# Patient Record
Sex: Female | Born: 1950 | Race: White | Hispanic: No | Smoking: Former smoker
Health system: Southern US, Community
[De-identification: ages and names within clinical notes are randomized; demographics above are authoritative.]

## PROBLEM LIST (undated history)

## (undated) DIAGNOSIS — K219 Gastro-esophageal reflux disease without esophagitis: Secondary | ICD-10-CM

## (undated) HISTORY — PX: BREAST BIOPSY: SHX20

---

## 2011-02-10 ENCOUNTER — Emergency Department: Payer: Self-pay | Admitting: Emergency Medicine

## 2012-09-15 ENCOUNTER — Ambulatory Visit: Payer: Self-pay | Admitting: Gastroenterology

## 2012-10-24 ENCOUNTER — Ambulatory Visit: Payer: Self-pay | Admitting: Gastroenterology

## 2012-10-25 LAB — PATHOLOGY REPORT

## 2014-08-07 ENCOUNTER — Other Ambulatory Visit: Payer: Self-pay | Admitting: Internal Medicine

## 2014-08-07 DIAGNOSIS — N63 Unspecified lump in unspecified breast: Secondary | ICD-10-CM

## 2014-08-07 DIAGNOSIS — R921 Mammographic calcification found on diagnostic imaging of breast: Secondary | ICD-10-CM

## 2014-08-15 ENCOUNTER — Ambulatory Visit
Admission: RE | Admit: 2014-08-15 | Discharge: 2014-08-15 | Disposition: A | Discharge status: Home / Self Care | Payer: BLUE CROSS/BLUE SHIELD | Source: Ambulatory Visit | Attending: Internal Medicine | Admitting: Internal Medicine

## 2014-08-15 ENCOUNTER — Ambulatory Visit: Payer: BLUE CROSS/BLUE SHIELD

## 2014-08-15 ENCOUNTER — Other Ambulatory Visit: Payer: Self-pay | Admitting: Internal Medicine

## 2014-08-15 ENCOUNTER — Ambulatory Visit: Payer: Self-pay

## 2014-08-15 DIAGNOSIS — N63 Unspecified lump in unspecified breast: Secondary | ICD-10-CM

## 2014-08-15 DIAGNOSIS — R921 Mammographic calcification found on diagnostic imaging of breast: Secondary | ICD-10-CM | POA: Insufficient documentation

## 2015-02-23 IMAGING — RF DG BARIUM SWALLOW
10 of 14 series · 15 of 23 positions shown · non-contrast
Comparison: none

REASON FOR EXAM: dysphagia
COMMENTS:

PROCEDURE:     FL  - FL BARIUM SWALLOW  - September 15, 2012  [DATE]
RESULT:     Standard barium swallow was obtained. Small sliding hiatal
hernia is noted with slightly prominent B ring. Barium tablet passed through
the ring with slight delay. No reflux.

[Series 1: fluoro_barium 2fps_bw · 0.18mm/px · 2 of 9 frames shown (1 of 10)]
[frame 2/9]
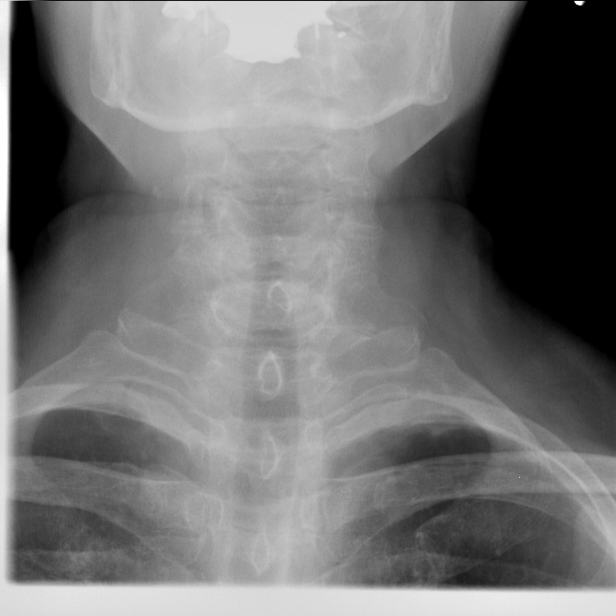
[frame 8/9]
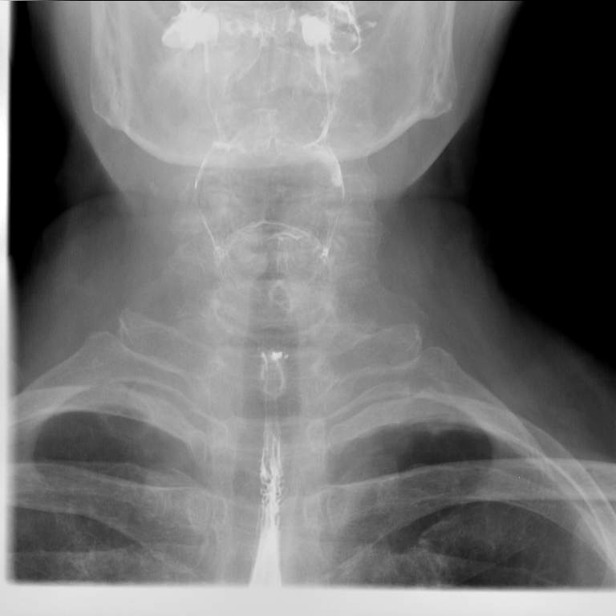

[Series 2: fluoro_barium 2fps_bw · 0.18mm/px · 3 of 6 frames shown (2 of 10)]
[frame 1/6]
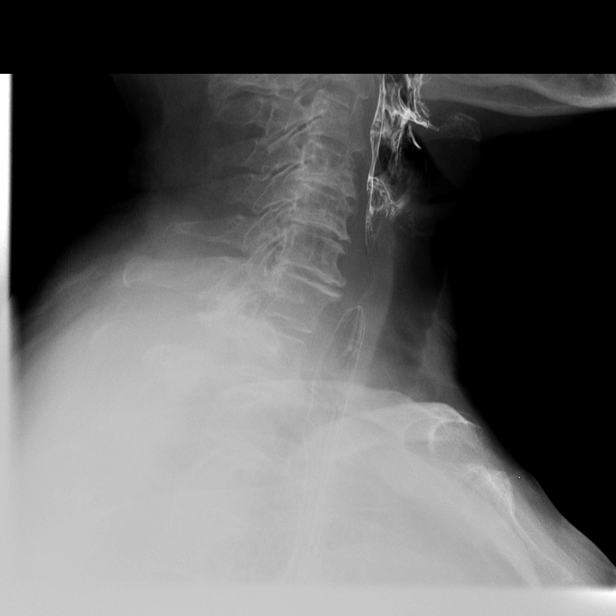
[frame 4/6]
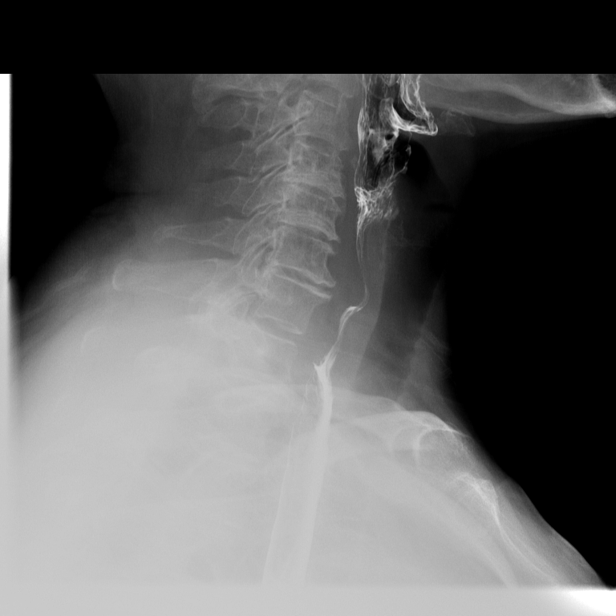
[frame 6/6]
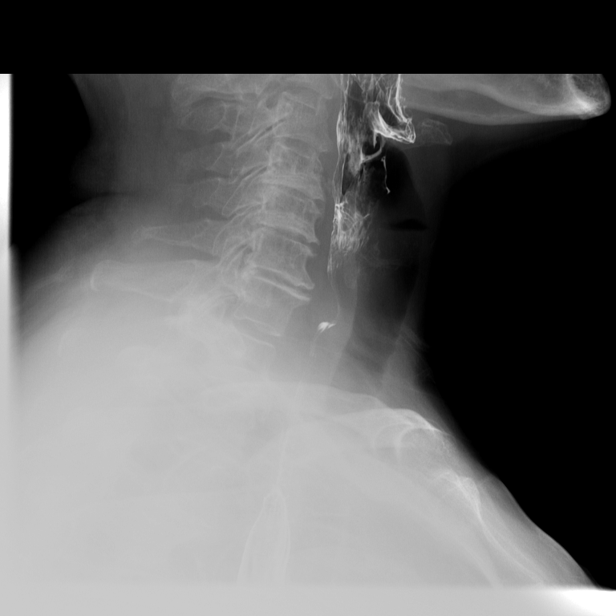

[Series 3: fluoro_barium 2fps_bw · 0.18mm/px · 2 of 4 frames shown (3 of 10)]
[frame 3/4]
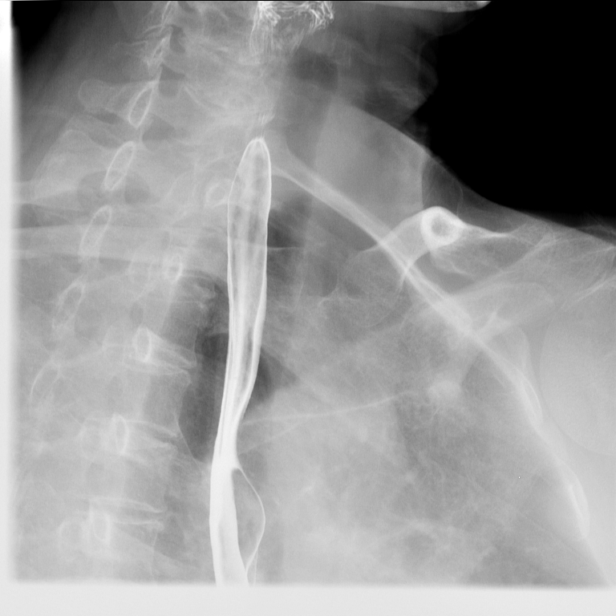
[frame 4/4]
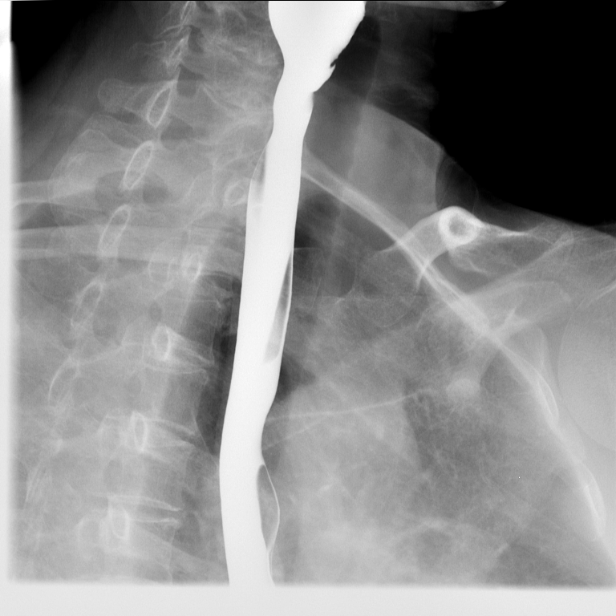

[Series 5: fluoro_barium 2fps_bw · 0.18mm/px · 1 of 1 slices shown (4 of 10)]
[im 1/1]
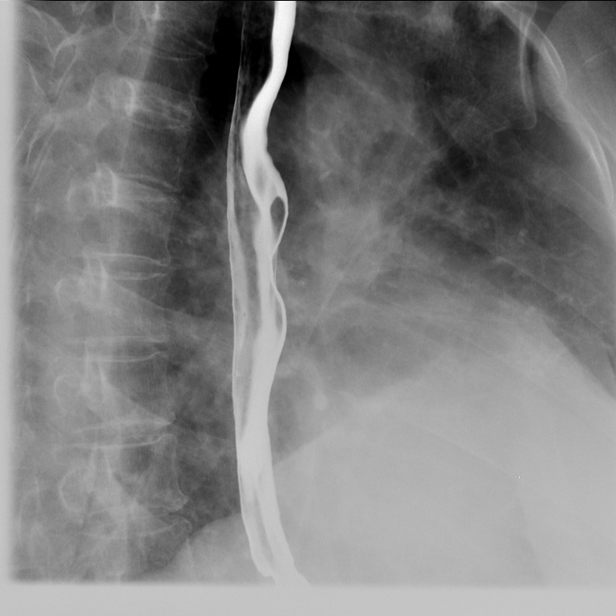

[Series 7: fluoro_barium 2fps_bw · 0.18mm/px · 1 of 1 slices shown (5 of 10)]
[im 1/1]
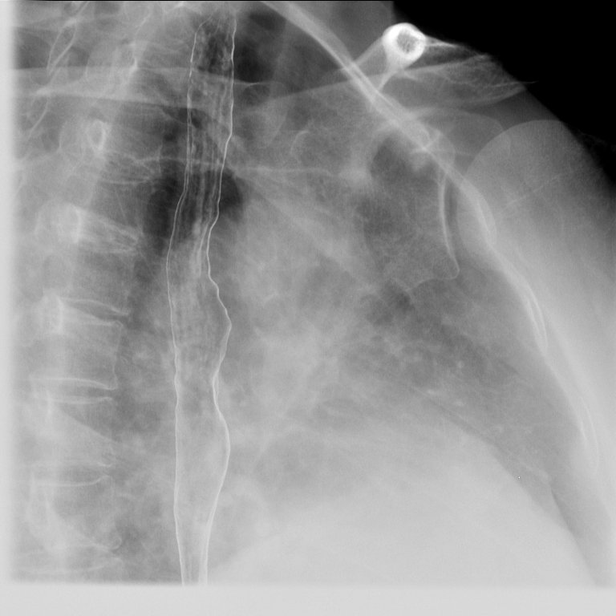

[Series 8: fluoro_barium 2fps_bw · 0.19mm/px · 1 of 1 slices shown (6 of 10)]
[im 1/1]
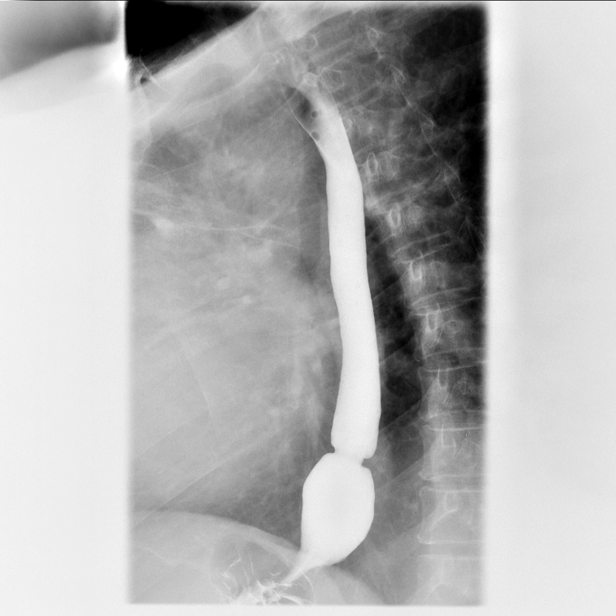

[Series 10: fluoro_barium 2fps_bw · 0.20mm/px · 2 of 3 frames shown (7 of 10)]
[frame 1/3]
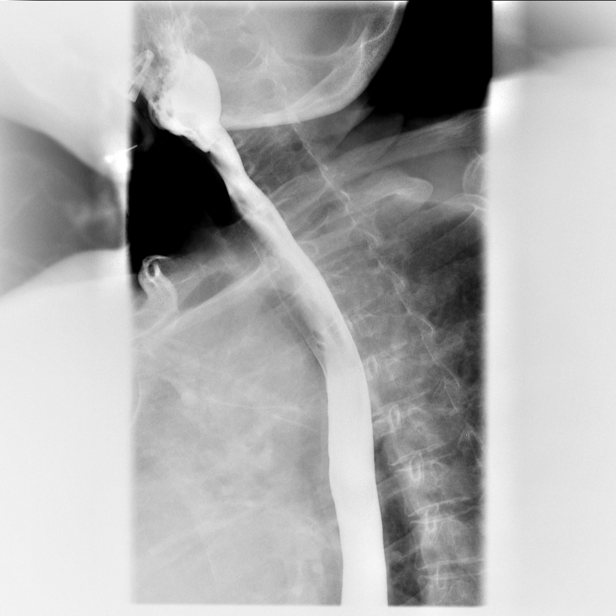
[frame 2/3]
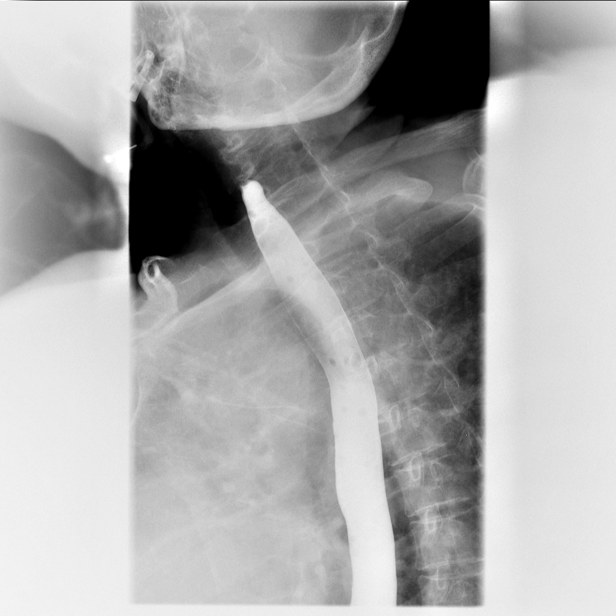

[Series 11: fluoro_barium 2fps_bw · 0.20mm/px · 1 of 1 slices shown (8 of 10)]
[im 1/1]
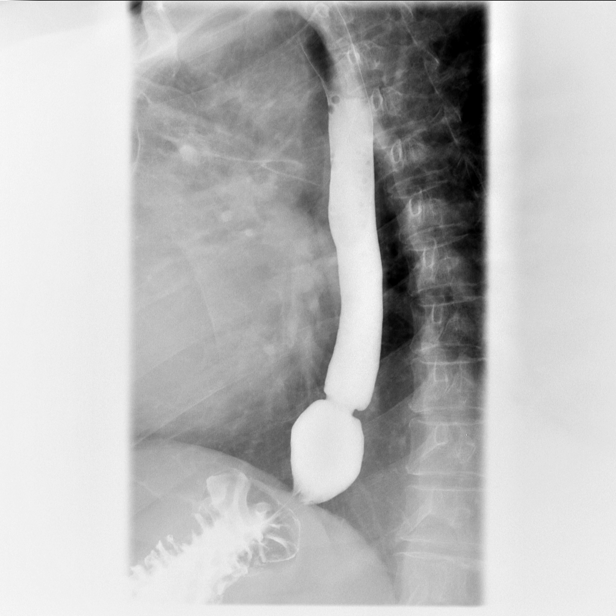

[Series 12: fluoro_barium 2fps_bw · 0.18mm/px · 1 of 1 slices shown (9 of 10)]
[im 1/1]
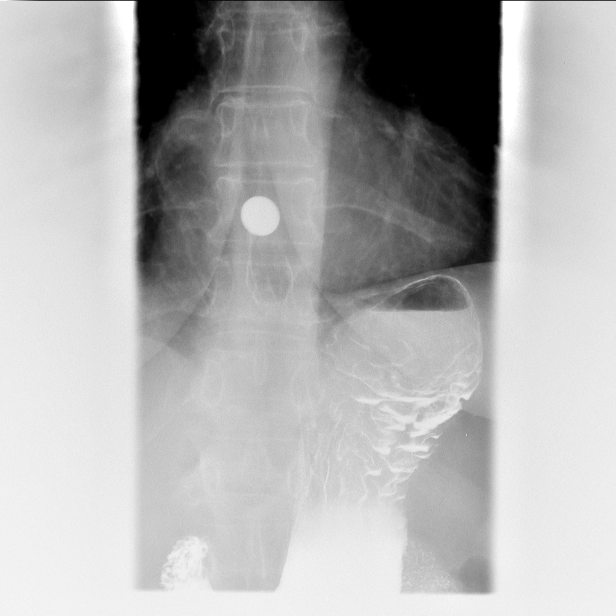

[Series 14: fluoro_barium 2fps_bw · 0.18mm/px · 1 of 1 slices shown (10 of 10)]
[im 1/1]
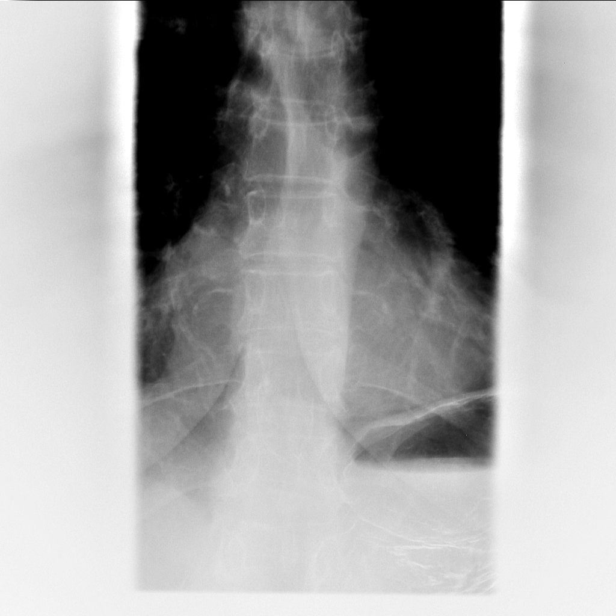

[15 of 23 positions shown; findings below may reference images not displayed]

IMPRESSION: Small hiatal hernia with B ring. There was slight delay in
passage of a standardized barium tablet.

## 2018-01-25 DIAGNOSIS — L237 Allergic contact dermatitis due to plants, except food: Secondary | ICD-10-CM | POA: Diagnosis not present

## 2018-01-25 DIAGNOSIS — Z23 Encounter for immunization: Secondary | ICD-10-CM | POA: Diagnosis not present

## 2022-02-02 DIAGNOSIS — D225 Melanocytic nevi of trunk: Secondary | ICD-10-CM | POA: Diagnosis not present

## 2022-02-02 DIAGNOSIS — L821 Other seborrheic keratosis: Secondary | ICD-10-CM | POA: Diagnosis not present

## 2022-02-02 DIAGNOSIS — L578 Other skin changes due to chronic exposure to nonionizing radiation: Secondary | ICD-10-CM | POA: Diagnosis not present

## 2022-02-02 DIAGNOSIS — C4441 Basal cell carcinoma of skin of scalp and neck: Secondary | ICD-10-CM | POA: Diagnosis not present

## 2022-02-02 DIAGNOSIS — D485 Neoplasm of uncertain behavior of skin: Secondary | ICD-10-CM | POA: Diagnosis not present

## 2022-02-02 DIAGNOSIS — D2262 Melanocytic nevi of left upper limb, including shoulder: Secondary | ICD-10-CM | POA: Diagnosis not present

## 2022-02-02 DIAGNOSIS — C44319 Basal cell carcinoma of skin of other parts of face: Secondary | ICD-10-CM | POA: Diagnosis not present

## 2022-02-02 DIAGNOSIS — D2261 Melanocytic nevi of right upper limb, including shoulder: Secondary | ICD-10-CM | POA: Diagnosis not present

## 2022-03-17 DIAGNOSIS — C44319 Basal cell carcinoma of skin of other parts of face: Secondary | ICD-10-CM | POA: Diagnosis not present

## 2022-04-07 DIAGNOSIS — Z48817 Encounter for surgical aftercare following surgery on the skin and subcutaneous tissue: Secondary | ICD-10-CM | POA: Diagnosis not present

## 2022-04-07 DIAGNOSIS — C44319 Basal cell carcinoma of skin of other parts of face: Secondary | ICD-10-CM | POA: Diagnosis not present

## 2022-04-14 DIAGNOSIS — Z48817 Encounter for surgical aftercare following surgery on the skin and subcutaneous tissue: Secondary | ICD-10-CM | POA: Diagnosis not present

## 2022-05-03 ENCOUNTER — Telehealth: Payer: Self-pay

## 2022-05-10 DIAGNOSIS — Z48817 Encounter for surgical aftercare following surgery on the skin and subcutaneous tissue: Secondary | ICD-10-CM | POA: Diagnosis not present

## 2022-05-27 ENCOUNTER — Encounter: Payer: Self-pay | Admitting: Plastic Surgery

## 2022-05-27 ENCOUNTER — Ambulatory Visit: Payer: PPO | Admitting: Plastic Surgery

## 2022-05-27 VITALS — BP 149/82 | HR 71 | Ht 59.0 in | Wt 142.8 lb

## 2022-05-27 DIAGNOSIS — C4441 Basal cell carcinoma of skin of scalp and neck: Secondary | ICD-10-CM

## 2022-05-27 DIAGNOSIS — M952 Other acquired deformity of head: Secondary | ICD-10-CM | POA: Diagnosis not present

## 2022-05-27 DIAGNOSIS — C4491 Basal cell carcinoma of skin, unspecified: Secondary | ICD-10-CM

## 2022-05-27 NOTE — Progress Notes (Signed)
   Referring Provider Modena Jansky, MD [address] [place],  [place] [address]   CC:  Chief Complaint  Patient presents with   Advice Only      Wendy Wilson is an 72 y.o. female.  HPI: Wendy Wilson was referred by Dr. 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  The initial Mohs reconstruction was in late January or early February per her husband and the wound has been slowly granulating since that time.  They are treating it with local wound care including using Vaseline to maintain moisture in the wound and over the exposed cranium.  No Known Allergies  Outpatient Encounter Medications as of 05/27/2022  Medication Sig   calcium carbonate (TUMS - DOSED IN MG ELEMENTAL CALCIUM) 500 MG chewable tablet Chew 1 tablet by mouth daily.   ibuprofen (ADVIL) 200 MG tablet Take by mouth.   No facility-administered encounter medications on file as of 05/27/2022.     No past medical history on file.  Past Surgical History:  Procedure Laterality Date   BREAST BIOPSY Right 20+ YRS AGO   EXCISIONAL - BENIGN    No family history on file.  Social History   Social History Narrative   Not on file     Review of Systems General: Denies fevers, chills, weight loss CV: Denies chest pain, shortness of breath, palpitations Scalp defects: 2 scalp defects 1 in the frontal region and 1 in the right parietal region  Physical Exam    05/27/2022   11:14 AM  Vitals with BMI  Height 4\' 11"   Weight 142 lbs 13 oz  BMI 28.83  Systolic 149  Diastolic 82  Pulse 71    General:  No acute distress,  Alert and oriented, Non-Toxic, Normal speech and affect Scalp defects as noted above. The frontal defect is essentially healed.  The parietal defect has exposed scalp of approximately 2 mm x 2 mm with granulation tissue around the rest of the wound. Mammogram: Not applicable Assessment/Plan Scalp defects: At this time I would be hesitant to take the patient for bone  burring while she is still healing.  I believe very close observation and maintaining a moist environment for healing are essential if we are going to continue conservative therapy.  I discussed the importance of the dressing changes with Wendy Wilson husband.  They are agreeable to the solution and will return to see me every 10 to 14 days while the wound is healing.  If the wound does not completely heal after the next 2 visits we will reconsider this and potentially take the patient to the operating room.  Santiago Glad 05/27/2022, 4:52 PM

## 2022-06-03 ENCOUNTER — Institutional Professional Consult (permissible substitution): Payer: PPO | Admitting: Plastic Surgery

## 2022-06-09 ENCOUNTER — Ambulatory Visit: Payer: PPO | Admitting: Plastic Surgery

## 2022-06-21 ENCOUNTER — Institutional Professional Consult (permissible substitution): Payer: BLUE CROSS/BLUE SHIELD | Admitting: Plastic Surgery

## 2022-08-16 ENCOUNTER — Institutional Professional Consult (permissible substitution): Payer: PPO | Admitting: Plastic Surgery

## 2023-01-27 DIAGNOSIS — H2513 Age-related nuclear cataract, bilateral: Secondary | ICD-10-CM | POA: Diagnosis not present

## 2023-01-27 DIAGNOSIS — H31011 Macula scars of posterior pole (postinflammatory) (post-traumatic), right eye: Secondary | ICD-10-CM | POA: Diagnosis not present

## 2023-01-27 DIAGNOSIS — H25013 Cortical age-related cataract, bilateral: Secondary | ICD-10-CM | POA: Diagnosis not present

## 2023-01-27 DIAGNOSIS — H524 Presbyopia: Secondary | ICD-10-CM | POA: Diagnosis not present

## 2023-02-17 DIAGNOSIS — H353114 Nonexudative age-related macular degeneration, right eye, advanced atrophic with subfoveal involvement: Secondary | ICD-10-CM | POA: Diagnosis not present

## 2023-02-17 DIAGNOSIS — H353131 Nonexudative age-related macular degeneration, bilateral, early dry stage: Secondary | ICD-10-CM | POA: Diagnosis not present

## 2023-02-17 DIAGNOSIS — H2513 Age-related nuclear cataract, bilateral: Secondary | ICD-10-CM | POA: Diagnosis not present

## 2023-02-17 DIAGNOSIS — H353121 Nonexudative age-related macular degeneration, left eye, early dry stage: Secondary | ICD-10-CM | POA: Diagnosis not present

## 2023-07-14 DIAGNOSIS — H43813 Vitreous degeneration, bilateral: Secondary | ICD-10-CM | POA: Diagnosis not present

## 2023-07-14 DIAGNOSIS — H31011 Macula scars of posterior pole (postinflammatory) (post-traumatic), right eye: Secondary | ICD-10-CM | POA: Diagnosis not present

## 2023-07-14 DIAGNOSIS — H2513 Age-related nuclear cataract, bilateral: Secondary | ICD-10-CM | POA: Diagnosis not present

## 2023-08-02 DIAGNOSIS — H2511 Age-related nuclear cataract, right eye: Secondary | ICD-10-CM | POA: Diagnosis not present

## 2023-08-02 DIAGNOSIS — H2513 Age-related nuclear cataract, bilateral: Secondary | ICD-10-CM | POA: Diagnosis not present

## 2023-08-08 ENCOUNTER — Encounter: Payer: Self-pay | Admitting: Ophthalmology

## 2023-08-09 NOTE — Discharge Instructions (Signed)

## 2023-08-12 ENCOUNTER — Ambulatory Visit
Admission: RE | Admit: 2023-08-12 | Discharge: 2023-08-12 | Disposition: A | Discharge status: Home / Self Care | Attending: Ophthalmology | Admitting: Ophthalmology

## 2023-08-12 ENCOUNTER — Encounter: Payer: Self-pay | Admitting: Ophthalmology

## 2023-08-12 ENCOUNTER — Other Ambulatory Visit: Payer: Self-pay

## 2023-08-12 ENCOUNTER — Ambulatory Visit: Payer: Self-pay | Admitting: Anesthesiology

## 2023-08-12 ENCOUNTER — Encounter
Admission: RE | Disposition: A | Discharge status: Home / Self Care | Payer: Self-pay | Source: Home / Self Care | Attending: Ophthalmology

## 2023-08-12 DIAGNOSIS — K219 Gastro-esophageal reflux disease without esophagitis: Secondary | ICD-10-CM | POA: Diagnosis not present

## 2023-08-12 DIAGNOSIS — Z87891 Personal history of nicotine dependence: Secondary | ICD-10-CM | POA: Diagnosis not present

## 2023-08-12 DIAGNOSIS — H2511 Age-related nuclear cataract, right eye: Secondary | ICD-10-CM | POA: Insufficient documentation

## 2023-08-12 HISTORY — DX: Gastro-esophageal reflux disease without esophagitis: K21.9

## 2023-08-12 HISTORY — PX: CATARACT EXTRACTION W/PHACO: SHX586

## 2023-08-12 SURGERY — PHACOEMULSIFICATION, CATARACT, WITH IOL INSERTION
Anesthesia: Monitor Anesthesia Care | Laterality: Right

## 2023-08-12 MED ORDER — MIDAZOLAM HCL 2 MG/2ML IJ SOLN
INTRAMUSCULAR | Status: AC
Start: 2023-08-12 — End: 2023-08-12
  Filled 2023-08-12: qty 2

## 2023-08-12 MED ORDER — MIDAZOLAM HCL 2 MG/2ML IJ SOLN
INTRAMUSCULAR | Status: DC | PRN
  Administered 2023-08-12 (×2): 1 mg via INTRAVENOUS

## 2023-08-12 MED ORDER — MOXIFLOXACIN HCL 0.5 % OP SOLN
OPHTHALMIC | Status: DC | PRN
  Administered 2023-08-12: .2 mL via OPHTHALMIC

## 2023-08-12 MED ORDER — FENTANYL CITRATE (PF) 100 MCG/2ML IJ SOLN
INTRAMUSCULAR | Status: DC | PRN
  Administered 2023-08-12: 50 ug via INTRAVENOUS

## 2023-08-12 MED ORDER — SIGHTPATH DOSE#1 NA HYALUR & NA CHOND-NA HYALUR IO KIT
PACK | INTRAOCULAR | Status: DC | PRN
  Administered 2023-08-12: 1 via OPHTHALMIC

## 2023-08-12 MED ORDER — LIDOCAINE HCL (PF) 2 % IJ SOLN
INTRAOCULAR | Status: DC | PRN
  Administered 2023-08-12: 1 mL via INTRAOCULAR

## 2023-08-12 MED ORDER — TETRACAINE HCL 0.5 % OP SOLN
OPHTHALMIC | Status: AC
  Filled 2023-08-12: qty 4

## 2023-08-12 MED ORDER — ARMC OPHTHALMIC DILATING DROPS
OPHTHALMIC | Status: AC
  Filled 2023-08-12: qty 0.5

## 2023-08-12 MED ORDER — EPINEPHRINE PF 1 MG/ML IJ SOLN
INTRAMUSCULAR | Status: DC | PRN
  Administered 2023-08-12: 112 mL via OPHTHALMIC

## 2023-08-12 MED ORDER — TETRACAINE HCL 0.5 % OP SOLN
1.0000 [drp] | OPHTHALMIC | Status: DC | PRN
  Administered 2023-08-12 (×3): 1 [drp] via OPHTHALMIC

## 2023-08-12 MED ORDER — FENTANYL CITRATE (PF) 100 MCG/2ML IJ SOLN
INTRAMUSCULAR | Status: AC
  Filled 2023-08-12: qty 2

## 2023-08-12 MED ORDER — LACTATED RINGERS IV SOLN
INTRAVENOUS | Status: DC
Start: 2023-08-12 — End: 2023-08-12

## 2023-08-12 MED ORDER — SIGHTPATH DOSE#1 BSS IO SOLN
INTRAOCULAR | Status: DC | PRN
  Administered 2023-08-12: 15 mL via INTRAOCULAR

## 2023-08-12 MED ORDER — ARMC OPHTHALMIC DILATING DROPS
1.0000 | OPHTHALMIC | Status: DC | PRN
  Administered 2023-08-12 (×3): 1 via OPHTHALMIC

## 2023-08-12 SURGICAL SUPPLY — 12 items
CATARACT SUITE SIGHTPATH (MISCELLANEOUS) ×1 IMPLANT
DISSECTOR HYDRO NUCLEUS 50X22 (MISCELLANEOUS) ×1 IMPLANT
FEE CATARACT SUITE SIGHTPATH (MISCELLANEOUS) ×1 IMPLANT
GLOVE PI ULTRA LF STRL 7.5 (GLOVE) ×1 IMPLANT
GLOVE SURG POLYISOPRENE 8.5 (GLOVE) ×1 IMPLANT
GLOVE SURG PROTEXIS BL SZ6.5 (GLOVE) ×1 IMPLANT
GLOVE SURG SYN 6.5 PF PI BL (GLOVE) ×1 IMPLANT
GLOVE SURG SYN 8.5 PF PI BL (GLOVE) ×1 IMPLANT
LENS IOL TECNIS EYHANCE 23.0 (Intraocular Lens) IMPLANT
NDL FILTER BLUNT 18X1 1/2 (NEEDLE) ×1 IMPLANT
NEEDLE FILTER BLUNT 18X1 1/2 (NEEDLE) ×1 IMPLANT
SYR 3ML LL SCALE MARK (SYRINGE) ×1 IMPLANT

## 2023-08-12 NOTE — Anesthesia Postprocedure Evaluation (Signed)
 Anesthesia Post Note  Patient: Wendy Wilson  Procedure(s) Performed: PHACOEMULSIFICATION, CATARACT, WITH IOL INSERTION 5.22, 00:40.7 (Right)  Patient location during evaluation: PACU Anesthesia Type: MAC Level of consciousness: awake and alert Pain management: pain level controlled Vital Signs Assessment: post-procedure vital signs reviewed and stable Respiratory status: spontaneous breathing, nonlabored ventilation and respiratory function stable Cardiovascular status: blood pressure returned to baseline and stable Postop Assessment: no apparent nausea or vomiting Anesthetic complications: no   No notable events documented.   Last Vitals:  Vitals:   08/12/23 1041 08/12/23 1047  BP: (!) 144/69 (!) 141/58  Pulse: 81 78  Resp: 17 (!) 21  Temp: (!) 36.4 C (!) 36.4 C  SpO2: 95% 95%    Last Pain:  Vitals:   08/12/23 1047  TempSrc:   PainSc: 0-No pain                 Fairy POUR Melissa Tomaselli

## 2023-08-12 NOTE — Transfer of Care (Signed)
 Immediate Anesthesia Transfer of Care Note  Patient: Wendy Wilson  Procedure(s) Performed: PHACOEMULSIFICATION, CATARACT, WITH IOL INSERTION 5.22, 00:40.7 (Right)  Patient Location: PACU  Anesthesia Type: MAC  Level of Consciousness: awake, alert  and patient cooperative  Airway and Oxygen Therapy: Patient Spontanous Breathing and Patient connected to supplemental oxygen  Post-op Assessment: Post-op Vital signs reviewed, Patient's Cardiovascular Status Stable, Respiratory Function Stable, Patent Airway and No signs of Nausea or vomiting  Post-op Vital Signs: Reviewed and stable  Complications: No notable events documented.

## 2023-08-12 NOTE — Op Note (Signed)
 OPERATIVE NOTE  Sherrine Salberg [id number] 08/12/2023   PREOPERATIVE DIAGNOSIS:  Nuclear sclerotic cataract right eye.  H25.11   POSTOPERATIVE DIAGNOSIS:    Nuclear sclerotic cataract right eye.     PROCEDURE:  Phacoemusification with posterior chamber intraocular lens placement of the right eye   LENS:   Implant Name Type Inv. Item Serial No. Manufacturer Lot No. LRB No. Used Action  LENS IOL TECNIS EYHANCE 23.0 - [id number] Intraocular Lens LENS IOL TECNIS EYHANCE 23.0 [id number] [employer]  Right 1 Implanted       Procedure(s): PHACOEMULSIFICATION, CATARACT, WITH IOL INSERTION 5.22, 00:40.7 (Right)  SURGEON:  Adine Novak, MD, MPH  ANESTHESIOLOGIST: Anesthesiologist: Piscitello, Fairy POUR, MD CRNA: Levy Harvey, CRNA; Jahoo, Sonia, CRNA   ANESTHESIA:  Topical with tetracaine drops augmented with 1% preservative-free intracameral lidocaine.  ESTIMATED BLOOD LOSS: less than 1 mL.   COMPLICATIONS:  None.   DESCRIPTION OF PROCEDURE:  The patient was identified in the holding room and transported to the operating room and placed in the supine position under the operating microscope.  The right eye was identified as the operative eye and it was prepped and draped in the usual sterile ophthalmic fashion.   A 1.0 millimeter clear-corneal paracentesis was made at the 10:30 position. 0.5 ml of preservative-free 1% lidocaine with epinephrine was injected into the anterior chamber.  The anterior chamber was filled with viscoelastic.  A 2.4 millimeter keratome was used to make a near-clear corneal incision at the 8:00 position.  A curvilinear capsulorrhexis was made with a cystotome and capsulorrhexis forceps.  Balanced salt solution was used to hydrodissect and hydrodelineate the nucleus.   Phacoemulsification was then used in stop and chop fashion to remove the lens nucleus and epinucleus.  The remaining cortex was then removed using the irrigation and aspiration handpiece.  Viscoelastic was then placed into the capsular bag to distend it for lens placement.  A lens was then injected into the capsular bag.  The remaining viscoelastic was aspirated.   Wounds were hydrated with balanced salt solution.  The anterior chamber was inflated to a physiologic pressure with balanced salt solution.   Intracameral vigamox 0.1 mL undiluted was injected into the eye and a drop placed onto the ocular surface.  No wound leaks were noted.  The patient was taken to the recovery room in stable condition without complications of anesthesia or surgery  Adine Novak 08/12/2023, 10:39 AM

## 2023-08-12 NOTE — Anesthesia Preprocedure Evaluation (Signed)
 Anesthesia Evaluation  Patient identified by MRN, date of birth, ID band Patient awake    Reviewed: Allergy & Precautions, NPO status , Patient's Chart, lab work & pertinent test results  History of Anesthesia Complications Negative for: history of anesthetic complications  Airway Mallampati: III  TM Distance: <3 FB Neck ROM: full    Dental  (+) Chipped, Upper Dentures   Pulmonary neg shortness of breath, former smoker   Pulmonary exam normal        Cardiovascular Exercise Tolerance: Good (-) angina negative cardio ROS Normal cardiovascular exam     Neuro/Psych negative neurological ROS  negative psych ROS   GI/Hepatic Neg liver ROS,GERD  Controlled,,  Endo/Other  negative endocrine ROS    Renal/GU      Musculoskeletal   Abdominal   Peds  Hematology negative hematology ROS (+)   Anesthesia Other Findings Past Medical History: No date: GERD (gastroesophageal reflux disease)     Comment:  HAS OCCASIONALLY  Past Surgical History: 20+ YRS AGO: BREAST BIOPSY; Right     Comment:  EXCISIONAL - BENIGN  BMI    Body Mass Index: 29.82 kg/m      Reproductive/Obstetrics negative OB ROS                             Anesthesia Physical Anesthesia Plan  ASA: 3  Anesthesia Plan: MAC   Post-op Pain Management:    Induction: Intravenous  PONV Risk Score and Plan:   Airway Management Planned: Natural Airway and Nasal Cannula  Additional Equipment:   Intra-op Plan:   Post-operative Plan:   Informed Consent: I have reviewed the patients History and Physical, chart, labs and discussed the procedure including the risks, benefits and alternatives for the proposed anesthesia with the patient or authorized representative who has indicated his/her understanding and acceptance.     Dental Advisory Given  Plan Discussed with: Anesthesiologist, CRNA and Surgeon  Anesthesia Plan Comments:  (Patient consented for risks of anesthesia including but not limited to:  - adverse reactions to medications - damage to eyes, teeth, lips or other oral mucosa - nerve damage due to positioning  - sore throat or hoarseness - Damage to heart, brain, nerves, lungs, other parts of body or loss of life  Patient voiced understanding and assent.)       Anesthesia Quick Evaluation

## 2023-08-12 NOTE — H&P (Signed)
 [facility]   Primary Care Physician:  [facility] Ophthalmologist: Dr. Adine Novak  Pre-Procedure History & Physical: HPI:  Wendy Wilson is a 73 y.o. female here for cataract surgery.   Past Medical History:  Diagnosis Date   GERD (gastroesophageal reflux disease)    HAS OCCASIONALLY    Past Surgical History:  Procedure Laterality Date   BREAST BIOPSY Right 20+ YRS AGO   EXCISIONAL - BENIGN    Prior to Admission medications   Medication Sig Start Date End Date Taking? Authorizing Provider  calcium carbonate (TUMS - DOSED IN MG ELEMENTAL CALCIUM) 500 MG chewable tablet Chew 1 tablet by mouth as needed for heartburn or indigestion.   Yes [provider]  ibuprofen (ADVIL) 200 MG tablet Take 600 mg by mouth 2 (two) times daily.   Yes [provider]  Misc Natural Products (NEURIVA PO) Take 1 tablet by mouth 1 day or 1 dose.   Yes [provider]  Multiple Vitamins-Minerals (PRESERVISION AREDS) TABS Take 1 tablet by mouth 1 day or 1 dose.   Yes [provider]  omeprazole (PRILOSEC) 20 MG capsule Take 20 mg by mouth as needed.   Yes [provider]  oxymetazoline (AFRIN) 0.05 % nasal spray Place 1 spray into both nostrils 2 (two) times daily as needed for congestion.   Yes [provider]  Pseudoeph-Doxylamine-DM-APAP (NYQUIL PO) Take 5 mL/day by mouth at bedtime.   Yes [provider]  trolamine salicylate (ASPERCREME) 10 % cream Apply 1 Application topically as needed for muscle pain.   Yes [provider]    Allergies as of 07/18/2023   (No Known Allergies)    History reviewed. No pertinent family history.  Social History   Socioeconomic History   Marital status: Widowed    Spouse name: Not on file   Number of children: Not on file   Years of education: Not on file   Highest education level: Not on file  Occupational History   Not on file  Tobacco Use   Smoking status:  Former    Types: Cigarettes   Smokeless tobacco: Never  Substance and Sexual Activity   Alcohol use: Yes    Alcohol/week: 1.0 standard drink of alcohol    Types: 1 Glasses of wine per week    Comment: 1 GLASS NIGHTLY   Drug use: Never   Sexual activity: Not on file  Other Topics Concern   Not on file  Social History Narrative   Not on file   Social Drivers of Health   Financial Resource Strain: Not on file  Food Insecurity: Not on file  Transportation Needs: Not on file  Physical Activity: Not on file  Stress: Not on file  Social Connections: Not on file  Intimate Partner Violence: Not on file    Review of Systems: See HPI, otherwise negative ROS  Physical Exam: BP (!) 158/74   Temp 98 F (36.7 C) (Temporal)   Ht 4' 11.02 (1.499 m)   Wt 67 kg   SpO2 96%   BMI 29.82 kg/m  General:   Alert, cooperative. Head:  Normocephalic and atraumatic. Respiratory:  Normal work of breathing. Cardiovascular:  NAD  Impression/Plan: Wendy Wilson is here for cataract surgery.  Risks, benefits, limitations, and alternatives regarding cataract surgery have been reviewed with the patient.  Questions have been answered.  All parties agreeable.   Adine Novak, MD  08/12/2023, 10:12 AM

## 2023-08-16 ENCOUNTER — Encounter: Payer: Self-pay | Admitting: Ophthalmology

## 2023-08-18 DIAGNOSIS — H2512 Age-related nuclear cataract, left eye: Secondary | ICD-10-CM | POA: Diagnosis not present

## 2023-08-18 NOTE — Anesthesia Preprocedure Evaluation (Addendum)
 Anesthesia Evaluation  Patient identified by MRN, date of birth, ID band Patient awake    Reviewed: Allergy & Precautions, H&P , NPO status , Patient's Chart, lab work & pertinent test results  Airway Mallampati: III  TM Distance: <3 FB Neck ROM: Full    Dental no notable dental hx. (+) Upper Dentures   Pulmonary neg pulmonary ROS, former smoker   Pulmonary exam normal breath sounds clear to auscultation       Cardiovascular negative cardio ROS Normal cardiovascular exam Rhythm:Regular Rate:Normal     Neuro/Psych negative neurological ROS  negative psych ROS   GI/Hepatic negative GI ROS, Neg liver ROS,GERD  ,,  Endo/Other  negative endocrine ROS    Renal/GU negative Renal ROS  negative genitourinary   Musculoskeletal negative musculoskeletal ROS (+)    Abdominal   Peds negative pediatric ROS (+)  Hematology negative hematology ROS (+)   Anesthesia Other Findings Previous cataract surgery 08-12-23 Dr. Stevan  Well controlled GERD  Reproductive/Obstetrics negative OB ROS                              Anesthesia Physical Anesthesia Plan  ASA: 1  Anesthesia Plan: MAC   Post-op Pain Management:    Induction: Intravenous  PONV Risk Score and Plan:   Airway Management Planned: Natural Airway and Nasal Cannula  Additional Equipment:   Intra-op Plan:   Post-operative Plan:   Informed Consent: I have reviewed the patients History and Physical, chart, labs and discussed the procedure including the risks, benefits and alternatives for the proposed anesthesia with the patient or authorized representative who has indicated his/her understanding and acceptance.     Dental Advisory Given  Plan Discussed with: Anesthesiologist, CRNA and Surgeon  Anesthesia Plan Comments: (Patient consented for risks of anesthesia including but not limited to:  - adverse reactions to  medications - damage to eyes, teeth, lips or other oral mucosa - nerve damage due to positioning  - sore throat or hoarseness - Damage to heart, brain, nerves, lungs, other parts of body or loss of life  Patient voiced understanding and assent.)         Anesthesia Quick Evaluation

## 2023-08-18 NOTE — Discharge Instructions (Signed)

## 2023-08-22 ENCOUNTER — Encounter
Admission: RE | Disposition: A | Discharge status: Home / Self Care | Payer: Self-pay | Source: Home / Self Care | Attending: Ophthalmology

## 2023-08-22 ENCOUNTER — Encounter: Payer: Self-pay | Admitting: Ophthalmology

## 2023-08-22 ENCOUNTER — Ambulatory Visit: Payer: Self-pay | Admitting: Anesthesiology

## 2023-08-22 ENCOUNTER — Ambulatory Visit
Admission: RE | Admit: 2023-08-22 | Discharge: 2023-08-22 | Disposition: A | Discharge status: Home / Self Care | Attending: Ophthalmology | Admitting: Ophthalmology

## 2023-08-22 ENCOUNTER — Other Ambulatory Visit: Payer: Self-pay

## 2023-08-22 DIAGNOSIS — K219 Gastro-esophageal reflux disease without esophagitis: Secondary | ICD-10-CM | POA: Insufficient documentation

## 2023-08-22 DIAGNOSIS — Z87891 Personal history of nicotine dependence: Secondary | ICD-10-CM | POA: Insufficient documentation

## 2023-08-22 DIAGNOSIS — H2512 Age-related nuclear cataract, left eye: Secondary | ICD-10-CM | POA: Diagnosis not present

## 2023-08-22 HISTORY — PX: CATARACT EXTRACTION W/PHACO: SHX586

## 2023-08-22 SURGERY — PHACOEMULSIFICATION, CATARACT, WITH IOL INSERTION
Anesthesia: Monitor Anesthesia Care | Site: Eye | Laterality: Left

## 2023-08-22 MED ORDER — SIGHTPATH DOSE#1 BSS IO SOLN
INTRAOCULAR | Status: DC | PRN
  Administered 2023-08-22: 80 mL via OPHTHALMIC

## 2023-08-22 MED ORDER — LIDOCAINE HCL (PF) 2 % IJ SOLN
INTRAOCULAR | Status: DC | PRN
  Administered 2023-08-22: 4 mL via INTRAOCULAR

## 2023-08-22 MED ORDER — MIDAZOLAM HCL 2 MG/2ML IJ SOLN
INTRAMUSCULAR | Status: AC
  Filled 2023-08-22: qty 2

## 2023-08-22 MED ORDER — ARMC OPHTHALMIC DILATING DROPS
1.0000 | OPHTHALMIC | Status: DC | PRN
  Administered 2023-08-22 (×3): 1 via OPHTHALMIC

## 2023-08-22 MED ORDER — FENTANYL CITRATE (PF) 100 MCG/2ML IJ SOLN
INTRAMUSCULAR | Status: DC | PRN
  Administered 2023-08-22: 50 ug via INTRAVENOUS

## 2023-08-22 MED ORDER — SIGHTPATH DOSE#1 NA HYALUR & NA CHOND-NA HYALUR IO KIT
PACK | INTRAOCULAR | Status: DC | PRN
  Administered 2023-08-22: 1 via OPHTHALMIC

## 2023-08-22 MED ORDER — TETRACAINE HCL 0.5 % OP SOLN
OPHTHALMIC | Status: AC
Start: 2023-08-22 — End: 2023-08-22
  Filled 2023-08-22: qty 4

## 2023-08-22 MED ORDER — LACTATED RINGERS IV SOLN: INTRAVENOUS | Status: DC

## 2023-08-22 MED ORDER — BRIMONIDINE TARTRATE-TIMOLOL 0.2-0.5 % OP SOLN
OPHTHALMIC | Status: DC | PRN
  Administered 2023-08-22: 1 [drp] via OPHTHALMIC

## 2023-08-22 MED ORDER — TETRACAINE HCL 0.5 % OP SOLN
1.0000 [drp] | OPHTHALMIC | Status: DC | PRN
Start: 2023-08-22 — End: 2023-08-22
  Administered 2023-08-22 (×3): 1 [drp] via OPHTHALMIC

## 2023-08-22 MED ORDER — ARMC OPHTHALMIC DILATING DROPS
OPHTHALMIC | Status: AC
Start: 2023-08-22 — End: 2023-08-22
  Filled 2023-08-22: qty 0.5

## 2023-08-22 MED ORDER — FENTANYL CITRATE (PF) 100 MCG/2ML IJ SOLN
INTRAMUSCULAR | Status: AC
  Filled 2023-08-22: qty 2

## 2023-08-22 MED ORDER — MOXIFLOXACIN HCL 0.5 % OP SOLN
OPHTHALMIC | Status: DC | PRN
  Administered 2023-08-22: .2 mL via OPHTHALMIC

## 2023-08-22 MED ORDER — SIGHTPATH DOSE#1 BSS IO SOLN
INTRAOCULAR | Status: DC | PRN
  Administered 2023-08-22: 15 mL via INTRAOCULAR

## 2023-08-22 MED ORDER — MIDAZOLAM HCL 2 MG/2ML IJ SOLN
INTRAMUSCULAR | Status: DC | PRN
Start: 2023-08-22 — End: 2023-08-22
  Administered 2023-08-22 (×2): 1 mg via INTRAVENOUS

## 2023-08-22 SURGICAL SUPPLY — 12 items
CATARACT SUITE SIGHTPATH (MISCELLANEOUS) ×1 IMPLANT
DISSECTOR HYDRO NUCLEUS 50X22 (MISCELLANEOUS) ×1 IMPLANT
FEE CATARACT SUITE SIGHTPATH (MISCELLANEOUS) ×1 IMPLANT
GLOVE PI ULTRA LF STRL 7.5 (GLOVE) ×1 IMPLANT
GLOVE SURG POLYISOPRENE 8.5 (GLOVE) ×1 IMPLANT
GLOVE SURG PROTEXIS BL SZ6.5 (GLOVE) ×1 IMPLANT
GLOVE SURG SYN 6.5 PF PI BL (GLOVE) ×1 IMPLANT
GLOVE SURG SYN 8.5 PF PI BL (GLOVE) ×1 IMPLANT
LENS IOL TECNIS EYHANCE 23.0 (Intraocular Lens) IMPLANT
NDL FILTER BLUNT 18X1 1/2 (NEEDLE) ×1 IMPLANT
NEEDLE FILTER BLUNT 18X1 1/2 (NEEDLE) ×1 IMPLANT
SYR 3ML LL SCALE MARK (SYRINGE) ×1 IMPLANT

## 2023-08-22 NOTE — Op Note (Signed)
 OPERATIVE NOTE  Wendy Wilson [id number] 08/22/2023   PREOPERATIVE DIAGNOSIS:  Nuclear sclerotic cataract left eye.  H25.12   POSTOPERATIVE DIAGNOSIS:    Nuclear sclerotic cataract left eye.     PROCEDURE:  Phacoemusification with posterior chamber intraocular lens placement of the left eye   LENS:   Implant Name Type Inv. Item Serial No. Manufacturer Lot No. LRB No. Used Action  LENS IOL TECNIS EYHANCE 23.0 - [id number] Intraocular Lens LENS IOL TECNIS EYHANCE 23.0 [id number] [employer]  Left 1 Implanted      Procedure(s): PHACOEMULSIFICATION, CATARACT, WITH IOL INSERTION 6.18 00:36.6 (Left)  SURGEON:  Adine Novak, MD, MPH   ANESTHESIA:  Topical with tetracaine  drops augmented with 1% preservative-free intracameral lidocaine .  ESTIMATED BLOOD LOSS: <1 mL   COMPLICATIONS:  None.   DESCRIPTION OF PROCEDURE:  The patient was identified in the holding room and transported to the operating room and placed in the supine position under the operating microscope.  The left eye was identified as the operative eye and it was prepped and draped in the usual sterile ophthalmic fashion.   A 1.0 millimeter clear-corneal paracentesis was made at the 5:00 position. 0.5 ml of preservative-free 1% lidocaine  with epinephrine  was injected into the anterior chamber.  The anterior chamber was filled with viscoelastic.  A 2.4 millimeter keratome was used to make a near-clear corneal incision at the 2:00 position.  A curvilinear capsulorrhexis was made with a cystotome and capsulorrhexis forceps.  Balanced salt solution was used to hydrodissect and hydrodelineate the nucleus.   Phacoemulsification was then used in stop and chop fashion to remove the lens nucleus and epinucleus.  The remaining cortex was then removed using the irrigation and aspiration handpiece. Viscoelastic was then placed into the capsular bag to distend it for lens placement.  A lens was then injected into the capsular bag.  The  remaining viscoelastic was aspirated.   Wounds were hydrated with balanced salt solution.  The anterior chamber was inflated to a physiologic pressure with balanced salt solution.  Intracameral vigamox  0.1 mL undiltued was injected into the eye and a drop placed onto the ocular surface. A tiny iris hemorrhage was noted, although there was no noted trauma or iatrogenic injury intraoperatively. A drop of combigan  was placed on the left eye.  No wound leaks were noted.  The patient was taken to the recovery room in stable condition without complications of anesthesia or surgery  Adine Novak 08/22/2023, 9:51 AM

## 2023-08-22 NOTE — Anesthesia Postprocedure Evaluation (Signed)
 Anesthesia Post Note  Patient: Ellarose Brandi  Procedure(s) Performed: PHACOEMULSIFICATION, CATARACT, WITH IOL INSERTION 6.18 00:36.6 (Left: Eye)  Patient location during evaluation: PACU Anesthesia Type: MAC Level of consciousness: awake and alert Pain management: pain level controlled Vital Signs Assessment: post-procedure vital signs reviewed and stable Respiratory status: spontaneous breathing, nonlabored ventilation, respiratory function stable and patient connected to nasal cannula oxygen Cardiovascular status: stable and blood pressure returned to baseline Postop Assessment: no apparent nausea or vomiting Anesthetic complications: no   No notable events documented.   Last Vitals:  Vitals:   08/22/23 0945 08/22/23 0957  BP: 135/60 133/75  Pulse: 74 77  Resp: 14 18  Temp: (!) 36.3 C (!) 36.4 C  SpO2: 96% 94%    Last Pain:  Vitals:   08/22/23 0957  TempSrc:   PainSc: 0-No pain                 Kalecia Hartney C Nery Kalisz

## 2023-08-22 NOTE — Transfer of Care (Signed)
 Immediate Anesthesia Transfer of Care Note  Patient: Wendy Wilson  Procedure(s) Performed: PHACOEMULSIFICATION, CATARACT, WITH IOL INSERTION 6.18 00:36.6 (Left: Eye)  Patient Location: PACU  Anesthesia Type: MAC  Level of Consciousness: awake, alert  and patient cooperative  Airway and Oxygen Therapy: Patient Spontanous Breathing and Patient connected to supplemental oxygen  Post-op Assessment: Post-op Vital signs reviewed, Patient's Cardiovascular Status Stable, Respiratory Function Stable, Patent Airway and No signs of Nausea or vomiting  Post-op Vital Signs: Reviewed and stable  Complications: No notable events documented.

## 2023-08-22 NOTE — H&P (Signed)
 [facility]   Primary Care Physician:  [facility] Ophthalmologist: Dr. Adine Novak  Pre-Procedure History & Physical: HPI:  Wendy Wilson is a 73 y.o. female here for cataract surgery.   Past Medical History:  Diagnosis Date   GERD (gastroesophageal reflux disease)    HAS OCCASIONALLY    Past Surgical History:  Procedure Laterality Date   BREAST BIOPSY Right 20+ YRS AGO   EXCISIONAL - BENIGN   CATARACT EXTRACTION W/PHACO Right 08/12/2023   Procedure: PHACOEMULSIFICATION, CATARACT, WITH IOL INSERTION 5.22, 00:40.7;  Surgeon: Novak Adine Anes, MD;  Location: [facility];  Service: Ophthalmology;  Laterality: Right;    Prior to Admission medications   Medication Sig Start Date End Date Taking? Authorizing Provider  calcium carbonate (TUMS - DOSED IN MG ELEMENTAL CALCIUM) 500 MG chewable tablet Chew 1 tablet by mouth as needed for heartburn or indigestion.   Yes [provider]  ibuprofen (ADVIL) 200 MG tablet Take 600 mg by mouth 2 (two) times daily.   Yes [provider]  Misc Natural Products (NEURIVA PO) Take 1 tablet by mouth 1 day or 1 dose.   Yes [provider]  Multiple Vitamins-Minerals (PRESERVISION AREDS) TABS Take 1 tablet by mouth 1 day or 1 dose.   Yes [provider]  omeprazole (PRILOSEC) 20 MG capsule Take 20 mg by mouth as needed.   Yes [provider]  oxymetazoline (AFRIN) 0.05 % nasal spray Place 1 spray into both nostrils 2 (two) times daily as needed for congestion.   Yes [provider]  Pseudoeph-Doxylamine-DM-APAP (NYQUIL PO) Take 5 mL/day by mouth at bedtime.   Yes [provider]  trolamine salicylate (ASPERCREME) 10 % cream Apply 1 Application topically as needed for muscle pain.   Yes [provider]    Allergies as of 07/18/2023   (No Known Allergies)    History reviewed. No pertinent family history.  Social History   Socioeconomic History    Marital status: Widowed    Spouse name: Not on file   Number of children: Not on file   Years of education: Not on file   Highest education level: Not on file  Occupational History   Not on file  Tobacco Use   Smoking status: Former    Types: Cigarettes   Smokeless tobacco: Never  Substance and Sexual Activity   Alcohol use: Yes    Alcohol/week: 1.0 standard drink of alcohol    Types: 1 Glasses of wine per week    Comment: 1 GLASS NIGHTLY   Drug use: Never   Sexual activity: Not on file  Other Topics Concern   Not on file  Social History Narrative   Not on file   Social Drivers of Health   Financial Resource Strain: Not on file  Food Insecurity: Not on file  Transportation Needs: Not on file  Physical Activity: Not on file  Stress: Not on file  Social Connections: Not on file  Intimate Partner Violence: Not on file    Review of Systems: See HPI, otherwise negative ROS  Physical Exam: There were no vitals taken for this visit. General:   Alert, cooperative. Head:  Normocephalic and atraumatic. Respiratory:  Normal work of breathing. Cardiovascular:  NAD  Impression/Plan: Wendy Wilson is here for cataract surgery.  Risks, benefits, limitations, and alternatives regarding cataract surgery have been reviewed with the patient.  Questions have been answered.  All parties agreeable.   Adine Novak, MD  08/22/2023,  8:40 AM

## 2023-10-14 DIAGNOSIS — M9903 Segmental and somatic dysfunction of lumbar region: Secondary | ICD-10-CM | POA: Diagnosis not present

## 2023-10-14 DIAGNOSIS — M62830 Muscle spasm of back: Secondary | ICD-10-CM | POA: Diagnosis not present

## 2023-10-14 DIAGNOSIS — M5418 Radiculopathy, sacral and sacrococcygeal region: Secondary | ICD-10-CM | POA: Diagnosis not present

## 2023-10-14 DIAGNOSIS — M5416 Radiculopathy, lumbar region: Secondary | ICD-10-CM | POA: Diagnosis not present

## 2023-10-18 DIAGNOSIS — M5418 Radiculopathy, sacral and sacrococcygeal region: Secondary | ICD-10-CM | POA: Diagnosis not present

## 2023-10-18 DIAGNOSIS — M9903 Segmental and somatic dysfunction of lumbar region: Secondary | ICD-10-CM | POA: Diagnosis not present

## 2023-10-18 DIAGNOSIS — M5416 Radiculopathy, lumbar region: Secondary | ICD-10-CM | POA: Diagnosis not present

## 2023-10-18 DIAGNOSIS — M62830 Muscle spasm of back: Secondary | ICD-10-CM | POA: Diagnosis not present

## 2023-10-20 DIAGNOSIS — M5416 Radiculopathy, lumbar region: Secondary | ICD-10-CM | POA: Diagnosis not present

## 2023-10-20 DIAGNOSIS — M9903 Segmental and somatic dysfunction of lumbar region: Secondary | ICD-10-CM | POA: Diagnosis not present

## 2023-10-20 DIAGNOSIS — M62830 Muscle spasm of back: Secondary | ICD-10-CM | POA: Diagnosis not present

## 2023-10-20 DIAGNOSIS — M5418 Radiculopathy, sacral and sacrococcygeal region: Secondary | ICD-10-CM | POA: Diagnosis not present

## 2023-10-24 DIAGNOSIS — M62830 Muscle spasm of back: Secondary | ICD-10-CM | POA: Diagnosis not present

## 2023-10-24 DIAGNOSIS — M9903 Segmental and somatic dysfunction of lumbar region: Secondary | ICD-10-CM | POA: Diagnosis not present

## 2023-10-24 DIAGNOSIS — M5418 Radiculopathy, sacral and sacrococcygeal region: Secondary | ICD-10-CM | POA: Diagnosis not present

## 2023-10-24 DIAGNOSIS — M5416 Radiculopathy, lumbar region: Secondary | ICD-10-CM | POA: Diagnosis not present

## 2023-10-26 DIAGNOSIS — M62830 Muscle spasm of back: Secondary | ICD-10-CM | POA: Diagnosis not present

## 2023-10-26 DIAGNOSIS — M9903 Segmental and somatic dysfunction of lumbar region: Secondary | ICD-10-CM | POA: Diagnosis not present

## 2023-10-26 DIAGNOSIS — M5416 Radiculopathy, lumbar region: Secondary | ICD-10-CM | POA: Diagnosis not present

## 2023-10-26 DIAGNOSIS — M5418 Radiculopathy, sacral and sacrococcygeal region: Secondary | ICD-10-CM | POA: Diagnosis not present

## 2023-10-27 DIAGNOSIS — M5416 Radiculopathy, lumbar region: Secondary | ICD-10-CM | POA: Diagnosis not present

## 2023-10-27 DIAGNOSIS — M5418 Radiculopathy, sacral and sacrococcygeal region: Secondary | ICD-10-CM | POA: Diagnosis not present

## 2023-10-27 DIAGNOSIS — M9903 Segmental and somatic dysfunction of lumbar region: Secondary | ICD-10-CM | POA: Diagnosis not present

## 2023-10-27 DIAGNOSIS — M62830 Muscle spasm of back: Secondary | ICD-10-CM | POA: Diagnosis not present

## 2023-10-31 DIAGNOSIS — M9903 Segmental and somatic dysfunction of lumbar region: Secondary | ICD-10-CM | POA: Diagnosis not present

## 2023-10-31 DIAGNOSIS — M5416 Radiculopathy, lumbar region: Secondary | ICD-10-CM | POA: Diagnosis not present

## 2023-10-31 DIAGNOSIS — M5418 Radiculopathy, sacral and sacrococcygeal region: Secondary | ICD-10-CM | POA: Diagnosis not present

## 2023-10-31 DIAGNOSIS — M62830 Muscle spasm of back: Secondary | ICD-10-CM | POA: Diagnosis not present

## 2023-11-02 DIAGNOSIS — M9903 Segmental and somatic dysfunction of lumbar region: Secondary | ICD-10-CM | POA: Diagnosis not present

## 2023-11-02 DIAGNOSIS — M5416 Radiculopathy, lumbar region: Secondary | ICD-10-CM | POA: Diagnosis not present

## 2023-11-02 DIAGNOSIS — M5418 Radiculopathy, sacral and sacrococcygeal region: Secondary | ICD-10-CM | POA: Diagnosis not present

## 2023-11-02 DIAGNOSIS — M62830 Muscle spasm of back: Secondary | ICD-10-CM | POA: Diagnosis not present

## 2023-11-03 DIAGNOSIS — M5418 Radiculopathy, sacral and sacrococcygeal region: Secondary | ICD-10-CM | POA: Diagnosis not present

## 2023-11-03 DIAGNOSIS — M5416 Radiculopathy, lumbar region: Secondary | ICD-10-CM | POA: Diagnosis not present

## 2023-11-03 DIAGNOSIS — M9903 Segmental and somatic dysfunction of lumbar region: Secondary | ICD-10-CM | POA: Diagnosis not present

## 2023-11-03 DIAGNOSIS — M62830 Muscle spasm of back: Secondary | ICD-10-CM | POA: Diagnosis not present

## 2023-11-08 DIAGNOSIS — M5418 Radiculopathy, sacral and sacrococcygeal region: Secondary | ICD-10-CM | POA: Diagnosis not present

## 2023-11-08 DIAGNOSIS — M62830 Muscle spasm of back: Secondary | ICD-10-CM | POA: Diagnosis not present

## 2023-11-08 DIAGNOSIS — M9903 Segmental and somatic dysfunction of lumbar region: Secondary | ICD-10-CM | POA: Diagnosis not present

## 2023-11-08 DIAGNOSIS — M5416 Radiculopathy, lumbar region: Secondary | ICD-10-CM | POA: Diagnosis not present

## 2023-11-10 DIAGNOSIS — M62830 Muscle spasm of back: Secondary | ICD-10-CM | POA: Diagnosis not present

## 2023-11-10 DIAGNOSIS — M9903 Segmental and somatic dysfunction of lumbar region: Secondary | ICD-10-CM | POA: Diagnosis not present

## 2023-11-10 DIAGNOSIS — M5418 Radiculopathy, sacral and sacrococcygeal region: Secondary | ICD-10-CM | POA: Diagnosis not present

## 2023-11-10 DIAGNOSIS — M5416 Radiculopathy, lumbar region: Secondary | ICD-10-CM | POA: Diagnosis not present

## 2023-11-15 DIAGNOSIS — M5418 Radiculopathy, sacral and sacrococcygeal region: Secondary | ICD-10-CM | POA: Diagnosis not present

## 2023-11-15 DIAGNOSIS — M5416 Radiculopathy, lumbar region: Secondary | ICD-10-CM | POA: Diagnosis not present

## 2023-11-15 DIAGNOSIS — M9903 Segmental and somatic dysfunction of lumbar region: Secondary | ICD-10-CM | POA: Diagnosis not present

## 2023-11-15 DIAGNOSIS — M62830 Muscle spasm of back: Secondary | ICD-10-CM | POA: Diagnosis not present

## 2023-11-22 DIAGNOSIS — M5418 Radiculopathy, sacral and sacrococcygeal region: Secondary | ICD-10-CM | POA: Diagnosis not present

## 2023-11-22 DIAGNOSIS — M9903 Segmental and somatic dysfunction of lumbar region: Secondary | ICD-10-CM | POA: Diagnosis not present

## 2023-11-22 DIAGNOSIS — M62830 Muscle spasm of back: Secondary | ICD-10-CM | POA: Diagnosis not present

## 2023-11-22 DIAGNOSIS — M5416 Radiculopathy, lumbar region: Secondary | ICD-10-CM | POA: Diagnosis not present

## 2023-12-01 DIAGNOSIS — M5418 Radiculopathy, sacral and sacrococcygeal region: Secondary | ICD-10-CM | POA: Diagnosis not present

## 2023-12-01 DIAGNOSIS — M9903 Segmental and somatic dysfunction of lumbar region: Secondary | ICD-10-CM | POA: Diagnosis not present

## 2023-12-01 DIAGNOSIS — M5416 Radiculopathy, lumbar region: Secondary | ICD-10-CM | POA: Diagnosis not present

## 2023-12-01 DIAGNOSIS — M62830 Muscle spasm of back: Secondary | ICD-10-CM | POA: Diagnosis not present

## 2023-12-15 DIAGNOSIS — M62830 Muscle spasm of back: Secondary | ICD-10-CM | POA: Diagnosis not present

## 2023-12-15 DIAGNOSIS — M5416 Radiculopathy, lumbar region: Secondary | ICD-10-CM | POA: Diagnosis not present

## 2023-12-15 DIAGNOSIS — M5418 Radiculopathy, sacral and sacrococcygeal region: Secondary | ICD-10-CM | POA: Diagnosis not present

## 2023-12-15 DIAGNOSIS — M9903 Segmental and somatic dysfunction of lumbar region: Secondary | ICD-10-CM | POA: Diagnosis not present

## 2024-01-05 DIAGNOSIS — M5418 Radiculopathy, sacral and sacrococcygeal region: Secondary | ICD-10-CM | POA: Diagnosis not present

## 2024-01-05 DIAGNOSIS — M62830 Muscle spasm of back: Secondary | ICD-10-CM | POA: Diagnosis not present

## 2024-01-05 DIAGNOSIS — M9903 Segmental and somatic dysfunction of lumbar region: Secondary | ICD-10-CM | POA: Diagnosis not present

## 2024-01-05 DIAGNOSIS — M5416 Radiculopathy, lumbar region: Secondary | ICD-10-CM | POA: Diagnosis not present

## 2024-01-26 DIAGNOSIS — M5418 Radiculopathy, sacral and sacrococcygeal region: Secondary | ICD-10-CM | POA: Diagnosis not present

## 2024-01-26 DIAGNOSIS — M62830 Muscle spasm of back: Secondary | ICD-10-CM | POA: Diagnosis not present

## 2024-01-26 DIAGNOSIS — M5416 Radiculopathy, lumbar region: Secondary | ICD-10-CM | POA: Diagnosis not present

## 2024-01-26 DIAGNOSIS — M9903 Segmental and somatic dysfunction of lumbar region: Secondary | ICD-10-CM | POA: Diagnosis not present
